# Patient Record
Sex: Female | Born: 1960 | Race: White | Hispanic: No | Marital: Single | Smoking: Never smoker
Health system: Southern US, Community
[De-identification: ages and names within clinical notes are randomized; demographics above are authoritative.]

## PROBLEM LIST (undated history)

## (undated) HISTORY — PX: ABDOMINAL HYSTERECTOMY: SHX81

## (undated) HISTORY — PX: TONSILLECTOMY: SUR1361

---

## 2015-04-22 ENCOUNTER — Emergency Department (HOSPITAL_COMMUNITY): Payer: 59

## 2015-04-22 ENCOUNTER — Emergency Department (HOSPITAL_COMMUNITY)
Admission: EM | Admit: 2015-04-22 | Discharge: 2015-04-22 | Disposition: A | Discharge status: Home / Self Care | Payer: 59 | Attending: Emergency Medicine | Admitting: Emergency Medicine

## 2015-04-22 ENCOUNTER — Encounter (HOSPITAL_COMMUNITY): Payer: Self-pay

## 2015-04-22 DIAGNOSIS — M549 Dorsalgia, unspecified: Secondary | ICD-10-CM | POA: Diagnosis not present

## 2015-04-22 DIAGNOSIS — R079 Chest pain, unspecified: Secondary | ICD-10-CM | POA: Insufficient documentation

## 2015-04-22 LAB — BASIC METABOLIC PANEL
Anion gap: 6 (ref 5–15)
BUN: 16 mg/dL (ref 6–20)
CALCIUM: 9.2 mg/dL (ref 8.9–10.3)
CHLORIDE: 107 mmol/L (ref 101–111)
CO2: 28 mmol/L (ref 22–32)
CREATININE: 0.92 mg/dL (ref 0.44–1.00)
GFR calc Af Amer: >60 mL/min (ref >60)
GFR calc non Af Amer: >60 mL/min (ref >60)
GLUCOSE: 89 mg/dL (ref 65–99)
Potassium: 4 mmol/L (ref 3.5–5.1)
Sodium: 141 mmol/L (ref 135–145)

## 2015-04-22 LAB — CBC WITH DIFFERENTIAL/PLATELET
Basophils Absolute: 0 10*3/uL (ref 0.0–0.1)
Basophils Relative: 0 %
Eosinophils Absolute: 0.2 10*3/uL (ref 0.0–0.7)
Eosinophils Relative: 3 %
HEMATOCRIT: 42.8 % (ref 36.0–46.0)
HEMOGLOBIN: 14.2 g/dL (ref 12.0–15.0)
LYMPHS ABS: 2.4 10*3/uL (ref 0.7–4.0)
Lymphocytes Relative: 33 %
MCH: 29.6 pg (ref 26.0–34.0)
MCHC: 33.2 g/dL (ref 30.0–36.0)
MCV: 89.4 fL (ref 78.0–100.0)
MONO ABS: 0.6 10*3/uL (ref 0.1–1.0)
MONOS PCT: 8 %
NEUTROS ABS: 3.9 10*3/uL (ref 1.7–7.7)
NEUTROS PCT: 56 %
Platelets: 184 10*3/uL (ref 150–400)
RBC: 4.79 MIL/uL (ref 3.87–5.11)
RDW: 13.5 % (ref 11.5–15.5)
WBC: 7 10*3/uL (ref 4.0–10.5)

## 2015-04-22 LAB — TROPONIN I: Troponin I: <0.03 ng/mL (ref <0.031)

## 2015-04-22 NOTE — ED Notes (Signed)
Pt states she has been having chest pain off and on since Monday. States the pain is in the center of her chest and radiates to her upper back and left arm. Pt was given 2 NTG and ASA 324 mg prior to arrival to ED. Pt states her pain was 5/10 prior to medication, now 2/10

## 2015-04-22 NOTE — ED Provider Notes (Signed)
CSN: [id number]     Arrival date & time 04/22/15  1337 History   First MD Initiated Contact with Patient 04/22/15 1356     Chief Complaint  Patient presents with  . Chest Pain     (Consider location/radiation/quality/duration/timing/severity/associated sxs/prior Treatment) Patient is a 55 y.o. female presenting with chest pain. The history is provided by the patient.  Chest Pain Associated symptoms: back pain   Associated symptoms: no fever, no headache, no nausea, no shortness of breath and not vomiting    patient with complaint of substernal chest pain radiating to left chest left axilla left arm and across the left part of the back since Monday periods been constant does wax and wane in its worst 7 out of 10. Currently 2 out of 10. Patient was brought in by EMS they gave her nitroglycerin for aspirin no real change in the pain. Patient status post cardiac cath 5 years ago without any significant findings. Pain described more as as an ache and sometimes it does get sharp.  History reviewed. No pertinent past medical history. Past Surgical History  Procedure Laterality Date  . Abdominal hysterectomy    . Tonsillectomy     No family history on file. Social History  Substance Use Topics  . Smoking status: Never Smoker   . Smokeless tobacco: None  . Alcohol Use: No   OB History    No data available     Review of Systems  Constitutional: Negative for fever.  HENT: Negative for congestion.   Eyes: Negative for visual disturbance.  Respiratory: Negative for shortness of breath.   Cardiovascular: Positive for chest pain.  Gastrointestinal: Negative for nausea and vomiting.  Genitourinary: Negative for dysuria.  Musculoskeletal: Positive for back pain.  Skin: Negative for rash.  Neurological: Negative for headaches.  Hematological: Does not bruise/bleed easily.      Allergies  Percocet  Home Medications   Prior to Admission medications   Not on File   BP 139/84 mmHg   Pulse 86  Temp(Src) 98.6 F (37 C) (Oral)  Resp 15  Ht 5\' 3"  (1.6 m)  Wt 86.183 kg  BMI 33.67 kg/m2  SpO2 97% Physical Exam  Constitutional: She is oriented to person, place, and time. She appears well-developed and well-nourished. No distress.  HENT:  Head: Normocephalic and atraumatic.  Mouth/Throat: Oropharynx is clear and moist.  Eyes: Conjunctivae and EOM are normal. Pupils are equal, round, and reactive to light.  Neck: Normal range of motion. Neck supple.  Cardiovascular: Normal rate, regular rhythm and normal heart sounds.   Pulmonary/Chest: Effort normal and breath sounds normal. No respiratory distress.  Abdominal: Soft. Bowel sounds are normal. There is no tenderness.  Musculoskeletal: Normal range of motion. She exhibits no edema.  Neurological: She is alert and oriented to person, place, and time. No cranial nerve deficit. She exhibits normal muscle tone. Coordination normal.  Skin: Skin is warm. No rash noted.  Nursing note and vitals reviewed.   ED Course  Procedures (including critical care time) Labs Review Labs Reviewed  CBC WITH DIFFERENTIAL/PLATELET  BASIC METABOLIC PANEL  TROPONIN I    Imaging Review Dg Chest Portable 1 View  04/22/2015  CLINICAL DATA:  Mid chest pain for several days, initial encounter EXAM: PORTABLE CHEST 1 VIEW COMPARISON:  None. FINDINGS: The heart size and mediastinal contours are within normal limits. Both lungs are clear. The visualized skeletal structures are unremarkable. IMPRESSION: No active disease. Electronically Signed   By: Eulah PontMark  Lukens M.D.  On: 04/22/2015 14:18   I have personally reviewed and evaluated these images and lab results as part of my medical decision-making.   EKG Interpretation   Date/Time:  Thursday April 22 2015 13:42:03 EDT Ventricular Rate:  77 PR Interval:  125 QRS Duration: 85 QT Interval:  365 QTC Calculation: 413 R Axis:   62 Text Interpretation:  Sinus rhythm Minimal ST depression,  inferior leads  No previous ECGs available Confirmed by Tor Tsuda  MD, Sindee Stucker [phone]) on  04/22/2015 1:58:55 PM      MDM   Final diagnoses:  Chest pain, unspecified chest pain type    Patient with constant chest pain since Monday. Cardiac workup without any acute cardiac findings. Chest x-ray negative for pneumonia pneumothorax. No hypoxia no concerns for pulmonary embolus. Troponin is negative. EKG without acute changes other than some minimal ST segment depression. No old EKG for comparison. Patient 5 years ago at cardiac cath without significant findings. Refer her back to cardiology.    Vanetta Mulders, MD 04/22/15 [phone]

## 2015-04-22 NOTE — Discharge Instructions (Signed)
A workup for the chest pain without acute findings. Recommend follow-up with cardiology referral information provided. Work note provided for today. Return for any new or worse symptoms.

## 2016-08-25 IMAGING — CR DG CHEST 1V PORT
1 series · 1 of 1 positions shown · non-contrast
Comparison: None.

CLINICAL DATA: Mid chest pain for several days, initial encounter

EXAM:
PORTABLE CHEST 1 VIEW

[ap portable]
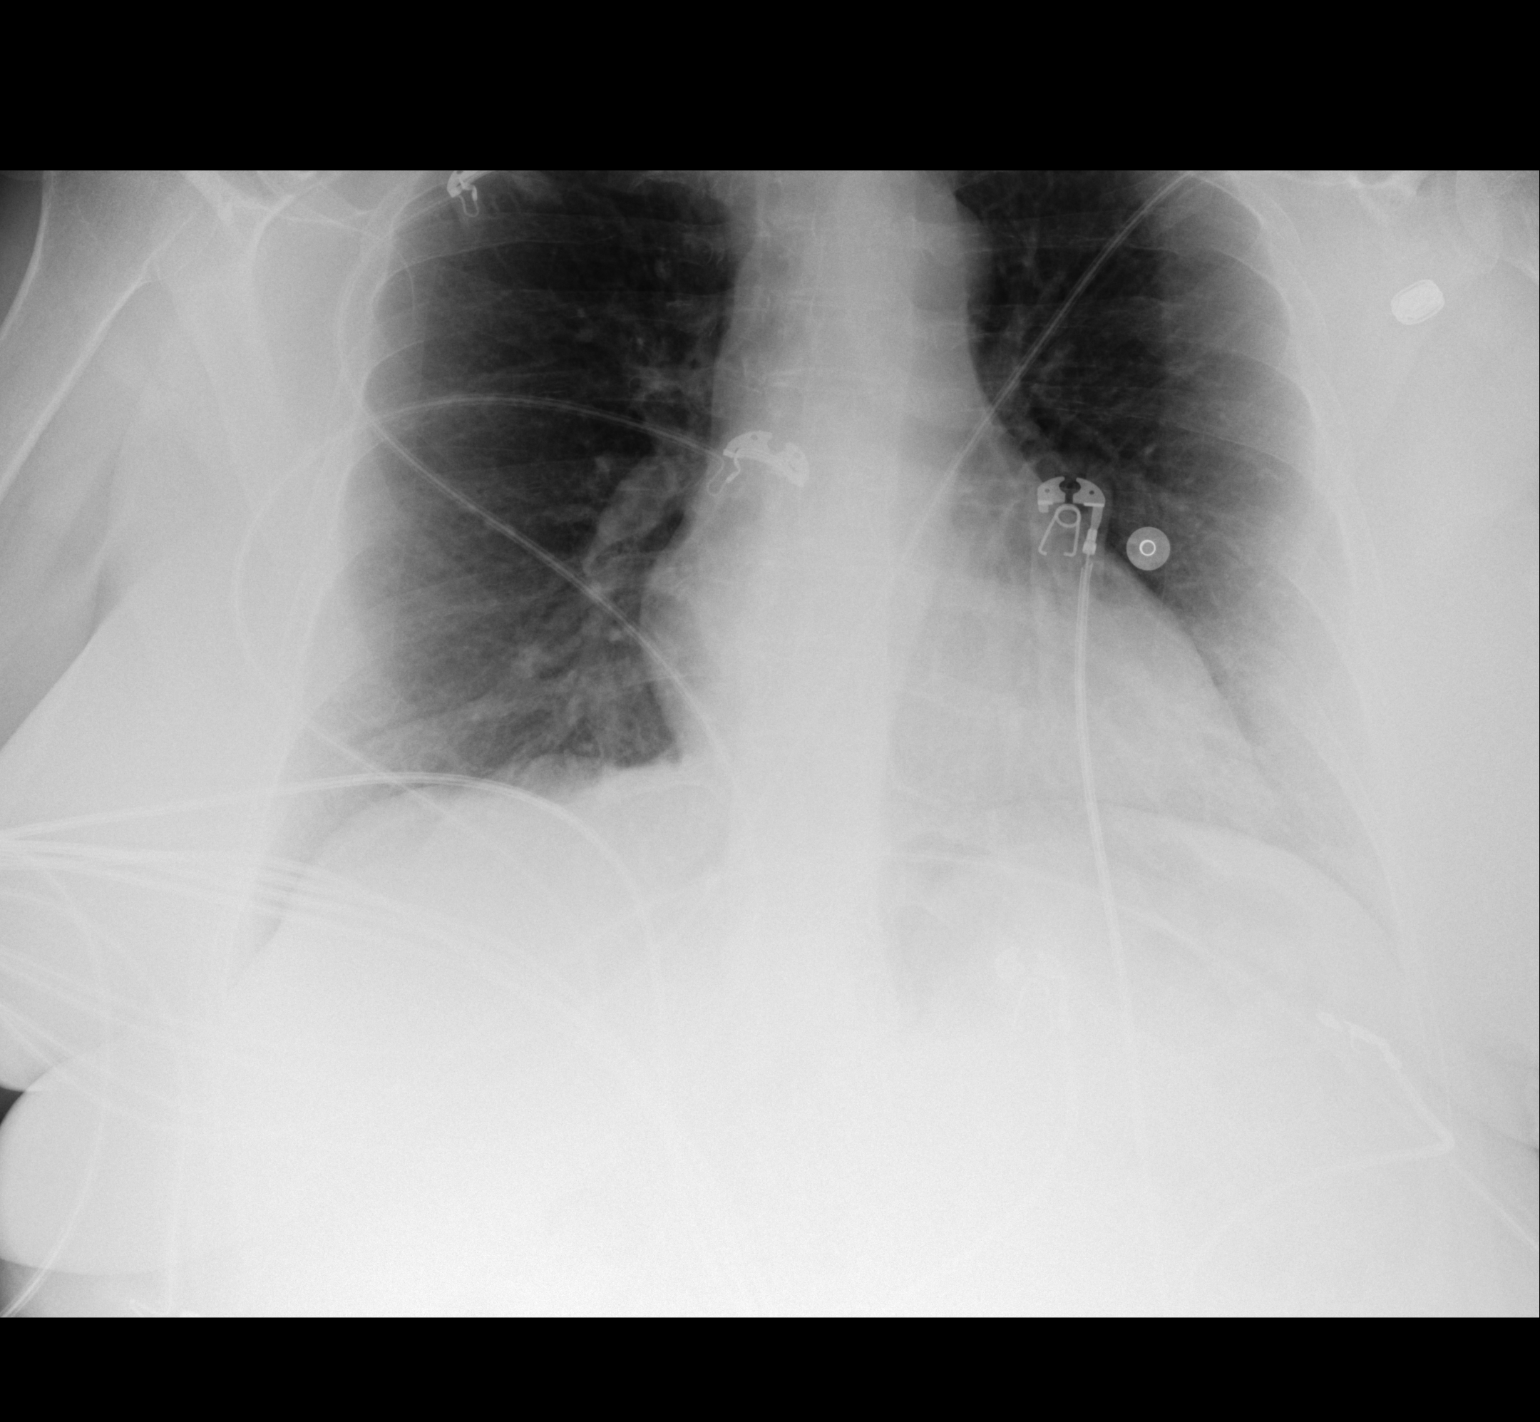

[1 of 1 positions shown; findings below may reference images not displayed]

FINDINGS: The heart size and mediastinal contours are within normal limits.
Both lungs are clear. The visualized skeletal structures are
unremarkable.
IMPRESSION: No active disease.
